# Patient Record
Sex: Male | Born: 1970 | Hispanic: No | Marital: Married | Smoking: Current some day smoker
Health system: Southern US, Community
[De-identification: ages and names within clinical notes are randomized; demographics above are authoritative.]

## PROBLEM LIST (undated history)

## (undated) DIAGNOSIS — I10 Essential (primary) hypertension: Secondary | ICD-10-CM

---

## 2007-02-04 DIAGNOSIS — K648 Other hemorrhoids: Secondary | ICD-10-CM | POA: Insufficient documentation

## 2007-08-18 DIAGNOSIS — M129 Arthropathy, unspecified: Secondary | ICD-10-CM

## 2007-08-18 DIAGNOSIS — K5730 Diverticulosis of large intestine without perforation or abscess without bleeding: Secondary | ICD-10-CM

## 2007-08-18 DIAGNOSIS — I10 Essential (primary) hypertension: Secondary | ICD-10-CM
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
## 2021-12-02 DIAGNOSIS — E876 Hypokalemia: Secondary | ICD-10-CM

## 2021-12-02 DIAGNOSIS — R519 Headache, unspecified: Secondary | ICD-10-CM

## 2021-12-02 DIAGNOSIS — R079 Chest pain, unspecified: Secondary | ICD-10-CM

## 2021-12-02 DIAGNOSIS — F17200 Nicotine dependence, unspecified, uncomplicated: Secondary | ICD-10-CM

## 2021-12-02 DIAGNOSIS — R7401 Elevation of levels of liver transaminase levels: Secondary | ICD-10-CM

## 2021-12-02 DIAGNOSIS — R9389 Abnormal findings on diagnostic imaging of other specified body structures: Secondary | ICD-10-CM

## 2021-12-02 DIAGNOSIS — I10 Essential (primary) hypertension: Principal | ICD-10-CM

## 2021-12-02 DIAGNOSIS — N179 Acute kidney failure, unspecified: Secondary | ICD-10-CM

## 2021-12-02 DIAGNOSIS — I161 Hypertensive emergency: Secondary | ICD-10-CM

## 2021-12-02 HISTORY — DX: Essential (primary) hypertension: I10

## 2021-12-02 NOTE — Assessment & Plan Note (Signed)
Creatinine 1.53, baseline unknown.  Suspect patient may have CKD unspecified related to HTN Continue to monitor renal function, avoid nephrotoxins

## 2021-12-02 NOTE — ED Notes (Signed)
Pt back from CT

## 2021-12-02 NOTE — ED Notes (Signed)
Pt to CT

## 2021-12-02 NOTE — Assessment & Plan Note (Signed)
Troponin elevation Troponin 30 with BNP 186.  EKG nonacute Suspect all related to demand ischemia from hypertensive emergency Continue to trend troponin Echocardiogram to evaluate for wall motion abnormality

## 2021-12-02 NOTE — ED Provider Notes (Signed)
[facility] Provider Note    Event Date/Time   First MD Initiated Contact with Patient 12/02/21 1953     (approximate)   History   Headache   HPI  Bryan Burgess is a 51 y.o. male who comes in with headaches, dizziness, nausea with high blood pressure for the past 3 days.  Patient been taking his hypertension meds but ran out of them 1 week ago.  Patient denies ever having history of headache.  He reports the headache was sudden and significant in onset and is been constant since then.  He did report a little bit of neck pain associated with it as well as some chest pain that is a 5 out of 10.  Denies ever having these previously.  He is not sure what blood pressure meds he previously was on.  He reports that with the blood pressure medicine given out front that he feels little bit more tired but he still is having his symptoms.     Physical Exam   Triage Vital Signs: ED Triage Vitals  Enc Vitals Group     BP 12/02/21 1835 (!) 258/139     Pulse Rate 12/02/21 1835 76     Resp 12/02/21 1835 17     Temp 12/02/21 1835 98.9 F (37.2 C)     Temp Source 12/02/21 1835 Oral     SpO2 12/02/21 1835 97 %     Weight 12/02/21 1838 210 lb (95.3 kg)     Height 12/02/21 1838 5' 4.96" (1.65 m)     Head Circumference --      Peak Flow --      Pain Score 12/02/21 1836 8     Pain Loc --      Pain Edu? --      Excl. in GC? --     Most recent vital signs: Vitals:   12/02/21 1835  BP: (!) 258/139  Pulse: 76  Resp: 17  Temp: 98.9 F (37.2 C)  SpO2: 97%     General: Awake, no distress.  CV:  Good peripheral perfusion.  Resp:  Normal effort.  Abd:  No distention.  No abdominal tenderness Other:  CN  Nerves II through XII appear intact.  Equal strength in arms and legs.   ED Results / Procedures / Treatments   Labs (all labs ordered are listed, but only abnormal results are displayed) Labs Reviewed  BASIC METABOLIC PANEL - Abnormal; Notable for the  following components:      Result Value   Potassium 3.2 (*)    Glucose, Bld 137 (*)    Creatinine, Ser 1.53 (*)    Calcium 8.8 (*)    GFR, Estimated 55 (*)    All other components within normal limits  CBC - Abnormal; Notable for the following components:   WBC 10.9 (*)    All other components within normal limits  TROPONIN I (HIGH SENSITIVITY) - Abnormal; Notable for the following components:   Troponin I (High Sensitivity) 27 (*)    All other components within normal limits     EKG  My interpretation of EKG:  Normal sinus rate of 68 with T wave inversions in 1-3 V4 V5 and V6 with normal intervals  RADIOLOGY I have reviewed the xray personally and interpreted and no evidence of widened mediastinum   PROCEDURES:  Critical Care performed: No  .1-3 Lead EKG Interpretation  Performed by: Concha Se, MD Authorized by: Concha Se, MD  Interpretation: normal     ECG rate:  77   ECG rate assessment: normal     Rhythm: sinus rhythm     Ectopy: none     Conduction: normal      MEDICATIONS ORDERED IN ED: Medications  cloNIDine (CATAPRES) tablet 0.1 mg (0.1 mg Oral Given 12/02/21 1846)     IMPRESSION / MDM / ASSESSMENT AND PLAN / ED COURSE  I reviewed the triage vital signs and the nursing notes.   Patient's presentation is most consistent with acute presentation with potential threat to life or bodily function.   Differential includes hypertensive emergency, urgency.  CT head ordered from triage that was negative and I am concerned about the possibility of aneurysm given significantly high elevated blood pressure therefore will get CTA.  As well as CT dissection given the chest pain.  I do not have any prior labs to evaluate.  Trop 27 Bmp cr elevated unclear baseline Cbc re-assuring  Patient's BP has come down significantly limit the clonidine but given he still having symptoms we will treat with migraine cocktail  Patient's headache is feeling better but  given his severely abnormal EKG and elevated blood pressures and the CT scans with incidental findings concerning for heart failure I feel that patient should be admitted for echocardiogram blood pressure monitoring and to get care established.  Patient is comfortable with this plan.  CTA still pending but given the headache is resolving I have low suspicion for aneurysmal bleed.  Will discuss with hospital team for admission.  I did discuss the incidental findings with patient on CT imaging and follow-up with pulmonary and repeat CT imaging to rule out cancer.      The patient is on the cardiac monitor to evaluate for evidence of arrhythmia and/or significant heart rate changes.      FINAL CLINICAL IMPRESSION(S) / ED DIAGNOSES   Final diagnoses:  Hypertension, unspecified type  Chest pain, unspecified type     Rx / DC Orders   ED Discharge Orders     None        Note:  This document was prepared using Dragon voice recognition software and may include unintentional dictation errors.   Concha Se, MD 12/02/21 2258

## 2021-12-02 NOTE — Assessment & Plan Note (Signed)
CTA chest showing the following  Masslike area in the LEFT lower lobe present as far back as 2008 with enlargement since that time. 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 Pulmonary consultation may be helpful as these areas can become superinfected.   Mild nodal enlargement throughout the chest without frank pathologic enlargement but with numerous lymph nodes without clear cause. The possibility of underlying malignancy is not excluded. Short interval follow-up within 3 months may be helpful to track for any changes. Would also correlate with any current signs of are history of heart failure as this can also present with mild nodal enlargement.

## 2021-12-02 NOTE — Assessment & Plan Note (Signed)
Mild transaminitis in the 50s with hepatic steatosis seen on CTA chest 

## 2021-12-02 NOTE — Assessment & Plan Note (Deleted)
Mild transaminitis in the 50s with hepatic steatosis seen on CTA chest

## 2021-12-02 NOTE — ED Triage Notes (Signed)
Patient reports headache, dizziness, nausea and high blood pressure x 3 days. Patient had been taking HTN meds, but ran out of them one week ago.

## 2021-12-02 NOTE — ED Provider Triage Note (Signed)
Emergency Medicine Provider Triage Evaluation Note  Bryan Burgess , a 51 y.o. male  was evaluated in triage.  Pt complains of 8 out of 10, acute and aching, frontal headache.  Patient states that he has been out of his blood pressure medicine for approximately 1 week and cannot remember what medicines he used to take.  He states that he has had associated dizziness and nausea but no vomiting.  Review of Systems  Positive: Patient has headache and dizziness. Negative: No chest pain or abdominal pain.   Physical Exam  BP (!) 258/139 (BP Location: Left Arm)   Pulse 76   Temp 98.9 F (37.2 C) (Oral)   Resp 17   SpO2 97%  Gen:   Awake, no distress   Resp:  Normal effort  MSK:   Moves extremities without difficulty    Medical Decision Making  Medically screening exam initiated at 6:38 PM.  Appropriate orders placed.  Bryan Burgess was informed that the remainder of the evaluation will be completed by another provider, this initial triage assessment does not replace that evaluation, and the importance of remaining in the ED until their evaluation is complete.     Pia Mau [place], [place] 12/02/21 1839

## 2021-12-03 DIAGNOSIS — N179 Acute kidney failure, unspecified: Secondary | ICD-10-CM

## 2021-12-03 DIAGNOSIS — E876 Hypokalemia: Secondary | ICD-10-CM

## 2021-12-03 DIAGNOSIS — F17200 Nicotine dependence, unspecified, uncomplicated: Secondary | ICD-10-CM

## 2021-12-03 DIAGNOSIS — R519 Headache, unspecified: Secondary | ICD-10-CM

## 2021-12-03 DIAGNOSIS — R7401 Elevation of levels of liver transaminase levels: Secondary | ICD-10-CM

## 2021-12-03 NOTE — Assessment & Plan Note (Signed)
Secondary to hypertensive emergency BP control Pain control

## 2021-12-03 NOTE — Assessment & Plan Note (Signed)
-  Nicotine patch 

## 2021-12-03 NOTE — Assessment & Plan Note (Signed)
Potassium 3.2.  Oral repletion ordered

## 2021-12-03 NOTE — ED Notes (Addendum)
Breakfast tray at bedside. Patient ambulatory to restroom without difficulties.

## 2021-12-03 NOTE — ED Notes (Signed)
Pt provided a sandwich tray - Dr. Para March @ the bedside.

## 2021-12-03 NOTE — Progress Notes (Signed)
*  PRELIMINARY RESULTS* Echocardiogram 2D Echocardiogram has been performed.  Bryan Burgess 12/03/2021, 3:55 PM

## 2021-12-03 NOTE — Progress Notes (Addendum)
PROGRESS NOTE    Bryan Burgess  ZOX:[id number] DOB: [birth year] DOA: 12/02/2021 PCP: Pcp, No    Assessment & Plan:   Principal Problem:   Hypertensive emergency Active Problems:   Chest pain   AKI (acute kidney injury) (HCC)   Abnormal chest CT   Transaminitis   Headache   Hypokalemia   Nicotine dependence  Assessment and Plan: Hypertensive emergency: continue on home dose of metoprolol, lisinopril & hydralazine. IV hydralazine prn   Acute systolic CHF: EF shows 45-50%, normal diastolic function, no regional wall motion abnormalities, no atrial shunts detected. Continue on lasix & metoprolol. Monitor I/Os   Chest pain: w/ elevated troponins. Likely secondary to demand ischemia.    Nicotine dependence: smoking cessation counseling. Nicotine patch to prevent w/drawal    Hypokalemia: potassium given. Mg is WNL    Headache: likely secondary to HTN emergency.    Transaminitis: likely due to hepatic steatosis seen on CTA. Will continue to monitor   AKI: Cr is trending down from day prior. Avoid nephrotoxic meds   Lymphadenopathy: w/ mass like area in left lower lobe present since 2008 but enlarged since that time. Etiology unclear, malignancy cannot be excluded. Will need to see pulmon outpatient for further evaluation and will discuss w/ pt   Obesity: BMI 34.9. Complicates overall care & prognosis      DVT prophylaxis: lovenox  Code Status: full  Family Communication:  Disposition Plan: likely d/c back home   Level of care: Med-Surg  Status is: Inpatient Remains inpatient appropriate because: severity of illness    Consultants:    Procedures:   Antimicrobials:  Subjective: Pt c/o headache   Objective: Vitals:   12/03/21 1004 12/03/21 1053 12/03/21 1441 12/03/21 1606  BP: (!) 186/116 (!) 167/105 (!) 182/123 (!) 194/130  Pulse: 69  65 64  Resp: 18  (!) 33 16  Temp:   97.6 F (36.4 C) 97.9 F (36.6 C)  TempSrc:   Oral   SpO2: 96%  95% 98%  Weight:       Height:       No intake or output data in the 24 hours ending 12/03/21 1634 Filed Weights   12/02/21 1838  Weight: 95.3 kg    Examination:  General exam: Appears calm and comfortable  Respiratory system: Clear to auscultation. Respiratory effort normal. Cardiovascular system: S1 & S2 +. No  rubs, gallops or clicks.  Gastrointestinal system: Abdomen is nondistended, soft and nontender.  Normal bowel sounds heard. Central nervous system: Alert and oriented. Moves all extremities  Psychiatry: Judgement and insight appear normal. Flat mood and affect     Data Reviewed: I have personally reviewed following labs and imaging studies  CBC: Recent Labs  Lab 12/02/21 1843  WBC 10.9*  HGB 15.8  HCT 44.7  MCV 82.2  PLT 261   Basic Metabolic Panel: Recent Labs  Lab 12/02/21 1843 12/03/21 0614  NA 140 137  K 3.2* 3.0*  CL 106 106  CO2 25 24  GLUCOSE 137* 120*  BUN 20 21*  CREATININE 1.53* 1.46*  CALCIUM 8.8* 8.5*  MG  --  2.3   GFR: Estimated Creatinine Clearance: 63.5 mL/min (A) (by C-G formula based on SCr of 1.46 mg/dL (H)). Liver Function Tests: Recent Labs  Lab 12/02/21 1843  AST 58*  ALT 55*  ALKPHOS 69  BILITOT 0.5  PROT 6.8  ALBUMIN 3.6   No results for input(s): "LIPASE", "AMYLASE" in the last 168 hours. No results for input(s): "AMMONIA" in  the last 168 hours. Coagulation Profile: No results for input(s): "INR", "PROTIME" in the last 168 hours. Cardiac Enzymes: No results for input(s): "CKTOTAL", "CKMB", "CKMBINDEX", "TROPONINI" in the last 168 hours. BNP (last 3 results) No results for input(s): "PROBNP" in the last 8760 hours. HbA1C: No results for input(s): "HGBA1C" in the last 72 hours. CBG: No results for input(s): "GLUCAP" in the last 168 hours. Lipid Profile: No results for input(s): "CHOL", "HDL", "LDLCALC", "TRIG", "CHOLHDL", "LDLDIRECT" in the last 72 hours. Thyroid Function Tests: No results for input(s): "TSH", "T4TOTAL",  "FREET4", "T3FREE", "THYROIDAB" in the last 72 hours. Anemia Panel: No results for input(s): "VITAMINB12", "FOLATE", "FERRITIN", "TIBC", "IRON", "RETICCTPCT" in the last 72 hours. Sepsis Labs: No results for input(s): "PROCALCITON", "LATICACIDVEN" in the last 168 hours.  No results found for this or any previous visit (from the past 240 hour(s)).       Radiology Studies: ECHOCARDIOGRAM COMPLETE  Result Date: 12/03/2021    ECHOCARDIOGRAM REPORT   Patient Name:   Bryan Burgess Date of Exam: 12/03/2021 Medical Rec #:  [id number]        Height:       65.0 in Accession #:    [id number]       Weight:       210.0 lb Date of Birth:  1971/02/26         BSA:          2.019 m Patient Age:    51 years         BP:           182/123 mmHg Patient Gender: M                HR:           68 bpm. Exam Location:  [facility] Procedure: 2D Echo, Color Doppler and Cardiac Doppler Indications:     I50.31 congestive heart failure-Acute Diastolic  History:         Patient has no prior history of Echocardiogram examinations.                  Risk Factors:Hypertension.  Sonographer:     Humphrey Rolls Referring Phys:  [id number] Andris Baumann Diagnosing Phys: Arnoldo Hooker MD  Sonographer Comments: Suboptimal parasternal window and suboptimal subcostal window. IMPRESSIONS  1. Left ventricular ejection fraction, by estimation, is 45 to 50%. The left ventricle has mildly decreased function. The left ventricle has no regional wall motion abnormalities. Left ventricular diastolic parameters were normal.  2. Right ventricular systolic function is normal. The right ventricular size is normal.  3. The mitral valve is normal in structure. Mild to moderate mitral valve regurgitation.  4. The aortic valve is normal in structure. Aortic valve regurgitation is not visualized. FINDINGS  Left Ventricle: Left ventricular ejection fraction, by estimation, is 45 to 50%. The left ventricle has mildly decreased function. The left ventricle has no regional  wall motion abnormalities. The left ventricular internal cavity size was normal in size. There is no left ventricular hypertrophy. Left ventricular diastolic parameters were normal. Right Ventricle: The right ventricular size is normal. No increase in right ventricular wall thickness. Right ventricular systolic function is normal. Left Atrium: Left atrial size was normal in size. Right Atrium: Right atrial size was normal in size. Pericardium: There is no evidence of pericardial effusion. Mitral Valve: The mitral valve is normal in structure. Mild to moderate mitral valve regurgitation. Tricuspid Valve: The tricuspid valve is normal in structure. Tricuspid valve regurgitation  is mild. Aortic Valve: The aortic valve is normal in structure. Aortic valve regurgitation is not visualized. Aortic valve mean gradient measures 11.0 mmHg. Aortic valve peak gradient measures 19.2 mmHg. Aortic valve area, by VTI measures 1.66 cm. Pulmonic Valve: The pulmonic valve was normal in structure. Pulmonic valve regurgitation is not visualized. Aorta: The aortic root and ascending aorta are structurally normal, with no evidence of dilitation. IAS/Shunts: No atrial level shunt detected by color flow Doppler.  LEFT VENTRICLE PLAX 2D LVIDd:         4.10 cm   Diastology LVIDs:         2.88 cm   LV e' medial:    5.11 cm/s LV PW:         1.72 cm   LV E/e' medial:  19.1 LV IVS:        1.49 cm   LV e' lateral:   5.66 cm/s LVOT diam:     1.90 cm   LV E/e' lateral: 17.3 LV SV:         62 LV SV Index:   31 LVOT Area:     2.84 cm  RIGHT VENTRICLE RV Basal diam:  3.45 cm RV S prime:     11.10 cm/s TAPSE (M-mode): 2.4 cm LEFT ATRIUM             Index        RIGHT ATRIUM           Index LA diam:        4.40 cm 2.18 cm/m   RA Area:     15.70 cm LA Vol (A2C):   55.0 ml 27.25 ml/m  RA Volume:   38.10 ml  18.87 ml/m LA Vol (A4C):   79.8 ml 39.53 ml/m LA Biplane Vol: 71.4 ml 35.37 ml/m  AORTIC VALVE                     PULMONIC VALVE AV Area  (Vmax):    1.58 cm      PV Vmax:       1.15 m/s AV Area (Vmean):   1.54 cm      PV Peak grad:  5.3 mmHg AV Area (VTI):     1.66 cm AV Vmax:           219.00 cm/s AV Vmean:          157.000 cm/s AV VTI:            0.374 m AV Peak Grad:      19.2 mmHg AV Mean Grad:      11.0 mmHg LVOT Vmax:         122.00 cm/s LVOT Vmean:        85.200 cm/s LVOT VTI:          0.219 m LVOT/AV VTI ratio: 0.59  AORTA Ao Root diam: 3.20 cm MITRAL VALVE               TRICUSPID VALVE MV Area (PHT): 3.48 cm    TR Peak grad:   43.8 mmHg MV Decel Time: 218 msec    TR Vmax:        331.00 cm/s MV E velocity: 97.70 cm/s MV A velocity: 67.70 cm/s  SHUNTS MV E/A ratio:  1.44        Systemic VTI:  0.22 m  Systemic Diam: 1.90 cm Arnoldo HookerBruce Kowalski MD Electronically signed by Arnoldo HookerBruce Kowalski MD Signature Date/Time: 12/03/2021/4:24:11 PM    Final    CT ANGIO HEAD NECK W WO CM  Result Date: 12/02/2021 CLINICAL DATA:  Initial evaluation for acute headache. EXAM: CT ANGIOGRAPHY HEAD AND NECK TECHNIQUE: Multidetector CT imaging of the head and neck was performed using the standard protocol during bolus administration of intravenous contrast. Multiplanar CT image reconstructions and MIPs were obtained to evaluate the vascular anatomy. Carotid stenosis measurements (when applicable) are obtained utilizing NASCET criteria, using the distal internal carotid diameter as the denominator. RADIATION DOSE REDUCTION: This exam was performed according to the departmental dose-optimization program which includes automated exposure control, adjustment of the mA and/or kV according to patient size and/or use of iterative reconstruction technique. CONTRAST:  125mL OMNIPAQUE IOHEXOL 350 MG/ML SOLN COMPARISON:  CT from earlier the same day. FINDINGS: CTA NECK FINDINGS Aortic arch: Visualized aortic arch normal caliber with standard 3 vessel morphology. No stenosis or other abnormality about the origin the great vessels. Right carotid system:  Right common and internal carotid arteries patent without stenosis or dissection. Left carotid system: Left common and internal carotid arteries patent without stenosis or dissection. Vertebral arteries: Both vertebral arteries arise from the subclavian arteries. No proximal subclavian artery stenosis. Both vertebral arteries widely patent without stenosis, dissection or occlusion. Skeleton: No discrete or worrisome osseous lesions. Other neck: No other acute soft tissue abnormality within the neck. Upper chest: Multiple mildly prominent subcentimeter lymph nodes noted within the visualized mediastinum, nonspecific, but could be reactive. Visualized upper chest demonstrates no other acute finding. Review of the MIP images confirms the above findings CTA HEAD FINDINGS Anterior circulation: Both internal carotid arteries widely patent to the termini without stenosis. A1 segments widely patent. Normal anterior communicating artery complex. Both anterior cerebral arteries widely patent to their distal aspects without stenosis. No M1 stenosis or occlusion. Normal MCA bifurcations. Distal MCA branches well perfused and symmetric. Posterior circulation: Both vertebral arteries patent without stenosis. Left vertebral artery dominant. Both PICA patent. Basilar patent to its distal aspect without stenosis. Superior cerebellar arteries patent bilaterally. Left PCA primarily supplied via the basilar. Predominant fetal type origin of the right PCA. Both PCAs patent to their distal aspects without stenosis. Venous sinuses: Patent allowing for timing the contrast bolus. Anatomic variants: As above.  No intracranial aneurysm. Review of the MIP images confirms the above findings IMPRESSION: 1. Normal CTA of the head and neck. No large vessel occlusion, hemodynamically significant stenosis, or other acute vascular abnormality. No aneurysm. 2. Multiple mildly prominent subcentimeter lymph nodes within the visualized mediastinum,  nonspecific, but could be reactive. Electronically Signed   By: Rise MuBenjamin  McClintock M.D.   On: 12/02/2021 23:03   CT Angio Chest Aorta W and/or Wo Contrast  Result Date: 12/02/2021 CLINICAL DATA:  51 year old male presents with suspected acute aortic syndrome and chest pain. EXAM: CT ANGIOGRAPHY CHEST WITH CONTRAST TECHNIQUE: Multidetector CT imaging of the chest was performed using the standard protocol during bolus administration of intravenous contrast. Multiplanar CT image reconstructions and MIPs were obtained to evaluate the vascular anatomy. RADIATION DOSE REDUCTION: This exam was performed according to the departmental dose-optimization program which includes automated exposure control, adjustment of the mA and/or kV according to patient size and/or use of iterative reconstruction technique. CONTRAST:  125mL OMNIPAQUE IOHEXOL 350 MG/ML SOLN COMPARISON:  Prior CT of the chest, abdomen and pelvis from 2008. FINDINGS: Cardiovascular: Noncontrast imaging without stranding adjacent to the aorta  and without signs of intramural hematoma. Aortic contour is smooth. Mildly limited study due to bolus timing but without signs of aortic dissection or aneurysm. Three-vessel branching pattern in the chest. Ascending thoracic aortic caliber 3.5 cm. Descending thoracic aortic caliber 2.6 cm. Heart size moderate to markedly enlarged. No pericardial effusion or signs of pericardial nodularity. Central pulmonary vasculature unremarkable on venous phase with limited assessment due to bolus timing. Mediastinum/Nodes: Scattered lymph nodes throughout the chest not exceeding 15 mm short axis but some with top-normal size, for instance (image 49/6) RIGHT paratracheal lymph node 14 mm. (Image 48/6) 14 mm AP window lymph node. Numerous other smaller lymph nodes in the chest. Lungs/Pleura: Ovoid masslike area in the LEFT lung base measuring 4.6 x 3.4 cm with mixed attenuation was present as far back as 2008 measuring 3.3 x 2.6 cm  appeared more compatible with atelectatic lung at that time. Subtle accessory fissure may track away from this area. No discrete vascular structure extending towards this area though perhaps a draining vein passing into the hemi azygous system (image 119/6). No pneumothorax. No pleural effusion. Airways are patent. Query mild septal thickening at the lung bases and mild ground-glass though there is clearly atelectasis and there is respiratory motion. Upper Abdomen: Marked hepatic steatosis. Lobular hepatic contours. Liver is incompletely imaged. Musculoskeletal: Spinal degenerative changes. No acute or destructive bone process. Review of the MIP images confirms the above findings. IMPRESSION: 1. No signs of acute aortic process. Study mildly limited by bolus timing. 2. Masslike area in the LEFT lower lobe present as far back as 2008 with enlargement since that time. 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 Venous drainage potentially via systemic venous drainage via azygous system. Pulmonary consultation may be helpful as these areas can become superinfected. 3. Peri mild septal thickening and some ground-glass attenuation though areas of atelectasis and respiratory motion limit assessment. Correlate with signs of heart failure. 4. Mild nodal enlargement throughout the chest without frank pathologic enlargement but with numerous lymph nodes without clear cause. The possibility of underlying malignancy is not excluded. Short interval follow-up within 3 months may be helpful to track for any changes. Would also correlate with any current signs of are history of heart failure as this can also present with mild nodal enlargement. 5. Hepatic steatosis. Correlate with clinical or laboratory evidence of hepatic dysfunction given severity of steatotic changes. Aortic Atherosclerosis (ICD10-I70.0). Electronically Signed   By:  Donzetta Kohut M.D.   On: 12/02/2021 21:51   DG Chest 2 View  Result Date: 12/02/2021 CLINICAL DATA:  Chest pain EXAM: CHEST - 2 VIEW COMPARISON:  None Available. FINDINGS: The heart size and mediastinal contours are within normal limits. Both lungs are clear. The visualized skeletal structures are unremarkable. IMPRESSION: No active cardiopulmonary disease. Electronically Signed   By: Charlett Nose M.D.   On: 12/02/2021 19:44   CT Head Wo Contrast  Result Date: 12/02/2021 CLINICAL DATA:  Male at age 67 presents for evaluation of headache, new or worsening headache. EXAM: CT HEAD WITHOUT CONTRAST TECHNIQUE: Contiguous axial images were obtained from the base of the skull through the vertex without intravenous contrast. RADIATION DOSE REDUCTION: This exam was performed according to the departmental dose-optimization program which includes automated exposure control, adjustment of the mA and/or kV according to patient size and/or use of iterative reconstruction technique. COMPARISON:  None Available. FINDINGS: Brain: No evidence of acute infarction, hemorrhage, hydrocephalus, extra-axial collection  or mass lesion/mass effect. Signs of mild atrophy and chronic microvascular ischemic change. Chronic appearing lacunar infarcts in the RIGHT and LEFT basal ganglia. Vascular: No hyperdense vessel or unexpected calcification. Skull: Normal. Negative for fracture or focal lesion. Sinuses/Orbits: Visualized paranasal sinuses and orbits without acute process. Maxillary sinuses are incompletely evaluated. Other: None IMPRESSION: 1. No acute intracranial abnormality. 2. Signs of mild atrophy and chronic microvascular ischemic change. Chronic appearing lacunar infarcts in the bilateral basal ganglia. Electronically Signed   By: Donzetta Kohut M.D.   On: 12/02/2021 19:18        Scheduled Meds:  aspirin EC  81 mg Oral Daily   enoxaparin (LOVENOX) injection  0.5 mg/kg Subcutaneous Q24H   furosemide  20 mg Intravenous  BID   hydrALAZINE  100 mg Oral Q8H   lisinopril  20 mg Oral Daily   metoprolol tartrate  50 mg Oral BID   nicotine  14 mg Transdermal Daily   Continuous Infusions:   LOS: 0 days    Time spent: 35 mins     Charise Killian, MD [facility] Pager [phone]  If 7PM-7AM, please contact night-coverage [email or web address] 12/03/2021, 4:34 PM

## 2021-12-03 NOTE — Progress Notes (Signed)
PHARMACIST - PHYSICIAN COMMUNICATION  CONCERNING:  Enoxaparin (Lovenox) for DVT Prophylaxis    RECOMMENDATION: Patient was prescribed enoxaprin 40mg  q24 hours for VTE prophylaxis.   Filed Weights   12/02/21 1838  Weight: 95.3 kg (210 lb)    Body mass index is 34.99 kg/m.  Estimated Creatinine Clearance: 60.6 mL/min (A) (by C-G formula based on SCr of 1.53 mg/dL (H)).   Based on [facility] policy patient is candidate for enoxaparin 0.5mg /kg TBW SQ every 24 hours based on BMI being >30.  DESCRIPTION: Pharmacy has adjusted enoxaparin dose per [facility] policy.  Patient is now receiving enoxaparin 0.5 mg/kg every 24 hours   [facility], PharmD, [facility] 12/03/2021 12:18 AM

## 2021-12-04 DIAGNOSIS — E876 Hypokalemia: Secondary | ICD-10-CM

## 2021-12-04 DIAGNOSIS — R519 Headache, unspecified: Secondary | ICD-10-CM

## 2021-12-04 DIAGNOSIS — I10 Essential (primary) hypertension: Secondary | ICD-10-CM

## 2021-12-04 NOTE — Progress Notes (Signed)
PROGRESS NOTE    Bryan Burgess  ZDG:[id number] DOB: 1970-12-30 DOA: 12/02/2021 PCP: Pcp, No    Assessment & Plan:   Principal Problem:   Hypertensive emergency Active Problems:   Chest pain   AKI (acute kidney injury) (HCC)   Abnormal chest CT   Transaminitis   Headache   Hypokalemia   Nicotine dependence  Assessment and Plan: Hypertensive emergency: emergency resolved but still w/ HTN. Continue on home dose of metoprolol, hydralazine, and increase dose of lisinopril. IV hydralazine prn   Acute systolic CHF: EF shows 45-50%, normal diastolic function, no regional wall motion abnormalities, no atrial shunts detected. Continue on lasix, metoprolol. Monitor I/Os    Chest pain: w/ elevated troponins. Likely secondary to demand ischemia    Nicotine dependence: received smoking cessation counseling x 5 mins. Nicotine patch to prevent w/drawl    Hypokalemia: KCl repleated    Headache: likely secondary to HTN emergency. Improved w/ fioricet    Transaminitis: likely due to hepatic steatosis seen on CTA. Will continue to monitor   AKI: Cr is labile. Avoid nephrotoxic meds   Lymphadenopathy: w/ mass like area in left lower lobe present since 2008 but enlarged since that time. Etiology unclear, malignancy cannot be excluded. Will need to see pulmon outpatient for further evaluation. Discussed w/ pt via interpreter and pt verbalized his understanding.   Obesity: BMI 37.3. Complicates overall care & prognosis      DVT prophylaxis: lovenox  Code Status: full  Family Communication:  Disposition Plan: likely d/c back home   Level of care: Med-Surg  Status is: Inpatient Remains inpatient appropriate because: severity of illness    Consultants:    Procedures:   Antimicrobials:  Subjective: Pt c/o fatigue   Objective: Vitals:   12/04/21 0643 12/04/21 0853 12/04/21 1358 12/04/21 1538  BP: 132/78 (!) 152/93 (!) 190/117 (!) 170/96  Pulse: 62 65 62 71  Resp:  16  16   Temp:  98 F (36.7 C)  97.8 F (36.6 C)  TempSrc:      SpO2:  96% 97% 96%  Weight:      Height:        Intake/Output Summary (Last 24 hours) at 12/04/2021 1557 Last data filed at 12/04/2021 1048 Gross per 24 hour  Intake 360 ml  Output 1200 ml  Net -840 ml   Filed Weights   12/02/21 1838 12/04/21 0335  Weight: 95.3 kg 101.8 kg    Examination:  General exam: Appears comfortable  Respiratory system: clear breath sounds b/l Cardiovascular system: S1/S2+. No rubs or gallops  Gastrointestinal system: Abd is soft, NT, obese & normal bowel sounds  Central nervous system: alert and oriented. Moves all extremities  Psychiatry: Judgement and insight appears normal. Flat mood and affect     Data Reviewed: I have personally reviewed following labs and imaging studies  CBC: Recent Labs  Lab 12/02/21 1843 12/04/21 0452  WBC 10.9* 9.1  HGB 15.8 15.8  HCT 44.7 45.5  MCV 82.2 82.3  PLT 261 232   Basic Metabolic Panel: Recent Labs  Lab 12/02/21 1843 12/03/21 0614 12/04/21 0452  NA 140 137 139  K 3.2* 3.0* 3.1*  CL 106 106 105  CO2 25 24 26   GLUCOSE 137* 120* 128*  BUN 20 21* 21*  CREATININE 1.53* 1.46* 1.52*  CALCIUM 8.8* 8.5* 8.3*  MG  --  2.3  --    GFR: Estimated Creatinine Clearance: 63.1 mL/min (A) (by C-G formula based on SCr of 1.52 mg/dL (  H)). Liver Function Tests: Recent Labs  Lab 12/02/21 1843  AST 58*  ALT 55*  ALKPHOS 69  BILITOT 0.5  PROT 6.8  ALBUMIN 3.6   No results for input(s): "LIPASE", "AMYLASE" in the last 168 hours. No results for input(s): "AMMONIA" in the last 168 hours. Coagulation Profile: No results for input(s): "INR", "PROTIME" in the last 168 hours. Cardiac Enzymes: No results for input(s): "CKTOTAL", "CKMB", "CKMBINDEX", "TROPONINI" in the last 168 hours. BNP (last 3 results) No results for input(s): "PROBNP" in the last 8760 hours. HbA1C: No results for input(s): "HGBA1C" in the last 72 hours. CBG: No results for  input(s): "GLUCAP" in the last 168 hours. Lipid Profile: No results for input(s): "CHOL", "HDL", "LDLCALC", "TRIG", "CHOLHDL", "LDLDIRECT" in the last 72 hours. Thyroid Function Tests: No results for input(s): "TSH", "T4TOTAL", "FREET4", "T3FREE", "THYROIDAB" in the last 72 hours. Anemia Panel: No results for input(s): "VITAMINB12", "FOLATE", "FERRITIN", "TIBC", "IRON", "RETICCTPCT" in the last 72 hours. Sepsis Labs: No results for input(s): "PROCALCITON", "LATICACIDVEN" in the last 168 hours.  No results found for this or any previous visit (from the past 240 hour(s)).       Radiology Studies: ECHOCARDIOGRAM COMPLETE  Result Date: 12/03/2021    ECHOCARDIOGRAM REPORT   Patient Name:   Bryan TREINEN Date of Exam: 12/03/2021 Medical Rec #:  [id number]        Height:       65.0 in Accession #:    [id number]       Weight:       210.0 lb Date of Birth:  May 12, 1971         BSA:          2.019 m Patient Age:    51 years         BP:           182/123 mmHg Patient Gender: M                HR:           68 bpm. Exam Location:  [facility] Procedure: 2D Echo, Color Doppler and Cardiac Doppler Indications:     I50.31 congestive heart failure-Acute Diastolic  History:         Patient has no prior history of Echocardiogram examinations.                  Risk Factors:Hypertension.  Sonographer:     Humphrey Rolls Referring Phys:  [id number] Andris Baumann Diagnosing Phys: Arnoldo Hooker MD  Sonographer Comments: Suboptimal parasternal window and suboptimal subcostal window. IMPRESSIONS  1. Left ventricular ejection fraction, by estimation, is 45 to 50%. The left ventricle has mildly decreased function. The left ventricle has no regional wall motion abnormalities. Left ventricular diastolic parameters were normal.  2. Right ventricular systolic function is normal. The right ventricular size is normal.  3. The mitral valve is normal in structure. Mild to moderate mitral valve regurgitation.  4. The aortic valve is normal in  structure. Aortic valve regurgitation is not visualized. FINDINGS  Left Ventricle: Left ventricular ejection fraction, by estimation, is 45 to 50%. The left ventricle has mildly decreased function. The left ventricle has no regional wall motion abnormalities. The left ventricular internal cavity size was normal in size. There is no left ventricular hypertrophy. Left ventricular diastolic parameters were normal. Right Ventricle: The right ventricular size is normal. No increase in right ventricular wall thickness. Right ventricular systolic function is normal. Left Atrium: Left atrial size was  normal in size. Right Atrium: Right atrial size was normal in size. Pericardium: There is no evidence of pericardial effusion. Mitral Valve: The mitral valve is normal in structure. Mild to moderate mitral valve regurgitation. Tricuspid Valve: The tricuspid valve is normal in structure. Tricuspid valve regurgitation is mild. Aortic Valve: The aortic valve is normal in structure. Aortic valve regurgitation is not visualized. Aortic valve mean gradient measures 11.0 mmHg. Aortic valve peak gradient measures 19.2 mmHg. Aortic valve area, by VTI measures 1.66 cm. Pulmonic Valve: The pulmonic valve was normal in structure. Pulmonic valve regurgitation is not visualized. Aorta: The aortic root and ascending aorta are structurally normal, with no evidence of dilitation. IAS/Shunts: No atrial level shunt detected by color flow Doppler.  LEFT VENTRICLE PLAX 2D LVIDd:         4.10 cm   Diastology LVIDs:         2.88 cm   LV e' medial:    5.11 cm/s LV PW:         1.72 cm   LV E/e' medial:  19.1 LV IVS:        1.49 cm   LV e' lateral:   5.66 cm/s LVOT diam:     1.90 cm   LV E/e' lateral: 17.3 LV SV:         62 LV SV Index:   31 LVOT Area:     2.84 cm  RIGHT VENTRICLE RV Basal diam:  3.45 cm RV S prime:     11.10 cm/s TAPSE (M-mode): 2.4 cm LEFT ATRIUM             Index        RIGHT ATRIUM           Index LA diam:        4.40 cm 2.18  cm/m   RA Area:     15.70 cm LA Vol (A2C):   55.0 ml 27.25 ml/m  RA Volume:   38.10 ml  18.87 ml/m LA Vol (A4C):   79.8 ml 39.53 ml/m LA Biplane Vol: 71.4 ml 35.37 ml/m  AORTIC VALVE                     PULMONIC VALVE AV Area (Vmax):    1.58 cm      PV Vmax:       1.15 m/s AV Area (Vmean):   1.54 cm      PV Peak grad:  5.3 mmHg AV Area (VTI):     1.66 cm AV Vmax:           219.00 cm/s AV Vmean:          157.000 cm/s AV VTI:            0.374 m AV Peak Grad:      19.2 mmHg AV Mean Grad:      11.0 mmHg LVOT Vmax:         122.00 cm/s LVOT Vmean:        85.200 cm/s LVOT VTI:          0.219 m LVOT/AV VTI ratio: 0.59  AORTA Ao Root diam: 3.20 cm MITRAL VALVE               TRICUSPID VALVE MV Area (PHT): 3.48 cm    TR Peak grad:   43.8 mmHg MV Decel Time: 218 msec    TR Vmax:        331.00 cm/s MV E velocity: 97.70  cm/s MV A velocity: 67.70 cm/s  SHUNTS MV E/A ratio:  1.44        Systemic VTI:  0.22 m                            Systemic Diam: 1.90 cm Arnoldo Hooker MD Electronically signed by Arnoldo Hooker MD Signature Date/Time: 12/03/2021/4:24:11 PM    Final    CT ANGIO HEAD NECK W WO CM  Result Date: 12/02/2021 CLINICAL DATA:  Initial evaluation for acute headache. EXAM: CT ANGIOGRAPHY HEAD AND NECK TECHNIQUE: Multidetector CT imaging of the head and neck was performed using the standard protocol during bolus administration of intravenous contrast. Multiplanar CT image reconstructions and MIPs were obtained to evaluate the vascular anatomy. Carotid stenosis measurements (when applicable) are obtained utilizing NASCET criteria, using the distal internal carotid diameter as the denominator. RADIATION DOSE REDUCTION: This exam was performed according to the departmental dose-optimization program which includes automated exposure control, adjustment of the mA and/or kV according to patient size and/or use of iterative reconstruction technique. CONTRAST:  OMNIPAQUE IOHEXOL 350 MG/ML SOLN COMPARISON:  CT  from earlier the same day. FINDINGS: CTA NECK FINDINGS Aortic arch: Visualized aortic arch normal caliber with standard 3 vessel morphology. No stenosis or other abnormality about the origin the great vessels. Right carotid system: Right common and internal carotid arteries patent without stenosis or dissection. Left carotid system: Left common and internal carotid arteries patent without stenosis or dissection. Vertebral arteries: Both vertebral arteries arise from the subclavian arteries. No proximal subclavian artery stenosis. Both vertebral arteries widely patent without stenosis, dissection or occlusion. Skeleton: No discrete or worrisome osseous lesions. Other neck: No other acute soft tissue abnormality within the neck. Upper chest: Multiple mildly prominent subcentimeter lymph nodes noted within the visualized mediastinum, nonspecific, but could be reactive. Visualized upper chest demonstrates no other acute finding. Review of the MIP images confirms the above findings CTA HEAD FINDINGS Anterior circulation: Both internal carotid arteries widely patent to the termini without stenosis. A1 segments widely patent. Normal anterior communicating artery complex. Both anterior cerebral arteries widely patent to their distal aspects without stenosis. No M1 stenosis or occlusion. Normal MCA bifurcations. Distal MCA branches well perfused and symmetric. Posterior circulation: Both vertebral arteries patent without stenosis. Left vertebral artery dominant. Both PICA patent. Basilar patent to its distal aspect without stenosis. Superior cerebellar arteries patent bilaterally. Left PCA primarily supplied via the basilar. Predominant fetal type origin of the right PCA. Both PCAs patent to their distal aspects without stenosis. Venous sinuses: Patent allowing for timing the contrast bolus. Anatomic variants: As above.  No intracranial aneurysm. Review of the MIP images confirms the above findings IMPRESSION: 1. Normal CTA  of the head and neck. No large vessel occlusion, hemodynamically significant stenosis, or other acute vascular abnormality. No aneurysm. 2. Multiple mildly prominent subcentimeter lymph nodes within the visualized mediastinum, nonspecific, but could be reactive. Electronically Signed   By: Rise Mu M.D.   On: 12/02/2021 23:03   CT Angio Chest Aorta W and/or Wo Contrast  Result Date: 12/02/2021 CLINICAL DATA:  51 year old male presents with suspected acute aortic syndrome and chest pain. EXAM: CT ANGIOGRAPHY CHEST WITH CONTRAST TECHNIQUE: Multidetector CT imaging of the chest was performed using the standard protocol during bolus administration of intravenous contrast. Multiplanar CT image reconstructions and MIPs were obtained to evaluate the vascular anatomy. RADIATION DOSE REDUCTION: This exam was performed according to the departmental dose-optimization  program which includes automated exposure control, adjustment of the mA and/or kV according to patient size and/or use of iterative reconstruction technique. CONTRAST:  125mL OMNIPAQUE IOHEXOL 350 MG/ML SOLN COMPARISON:  Prior CT of the chest, abdomen and pelvis from 2008. FINDINGS: Cardiovascular: Noncontrast imaging without stranding adjacent to the aorta and without signs of intramural hematoma. Aortic contour is smooth. Mildly limited study due to bolus timing but without signs of aortic dissection or aneurysm. Three-vessel branching pattern in the chest. Ascending thoracic aortic caliber 3.5 cm. Descending thoracic aortic caliber 2.6 cm. Heart size moderate to markedly enlarged. No pericardial effusion or signs of pericardial nodularity. Central pulmonary vasculature unremarkable on venous phase with limited assessment due to bolus timing. Mediastinum/Nodes: Scattered lymph nodes throughout the chest not exceeding 15 mm short axis but some with top-normal size, for instance (image 49/6) RIGHT paratracheal lymph node 14 mm. (Image 48/6) 14 mm  AP window lymph node. Numerous other smaller lymph nodes in the chest. Lungs/Pleura: Ovoid masslike area in the LEFT lung base measuring 4.6 x 3.4 cm with mixed attenuation was present as far back as 2008 measuring 3.3 x 2.6 cm appeared more compatible with atelectatic lung at that time. Subtle accessory fissure may track away from this area. No discrete vascular structure extending towards this area though perhaps a draining vein passing into the hemi azygous system (image 119/6). No pneumothorax. No pleural effusion. Airways are patent. Query mild septal thickening at the lung bases and mild ground-glass though there is clearly atelectasis and there is respiratory motion. Upper Abdomen: Marked hepatic steatosis. Lobular hepatic contours. Liver is incompletely imaged. Musculoskeletal: Spinal degenerative changes. No acute or destructive bone process. Review of the MIP images confirms the above findings. IMPRESSION: 1. No signs of acute aortic process. Study mildly limited by bolus timing. 2. Masslike area in the LEFT lower lobe present as far back as 2008 with enlargement since that time. 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 Venous drainage potentially via systemic venous drainage via azygous system. Pulmonary consultation may be helpful as these areas can become superinfected. 3. Peri mild septal thickening and some ground-glass attenuation though areas of atelectasis and respiratory motion limit assessment. Correlate with signs of heart failure. 4. Mild nodal enlargement throughout the chest without frank pathologic enlargement but with numerous lymph nodes without clear cause. The possibility of underlying malignancy is not excluded. Short interval follow-up within 3 months may be helpful to track for any changes. Would also correlate with any current signs of are history of heart failure as this can also present  with mild nodal enlargement. 5. Hepatic steatosis. Correlate with clinical or laboratory evidence of hepatic dysfunction given severity of steatotic changes. Aortic Atherosclerosis (ICD10-I70.0). Electronically Signed   By: Donzetta KohutGeoffrey  Wile M.D.   On: 12/02/2021 21:51   DG Chest 2 View  Result Date: 12/02/2021 CLINICAL DATA:  Chest pain EXAM: CHEST - 2 VIEW COMPARISON:  None Available. FINDINGS: The heart size and mediastinal contours are within normal limits. Both lungs are clear. The visualized skeletal structures are unremarkable. IMPRESSION: No active cardiopulmonary disease. Electronically Signed   By: Charlett NoseKevin  Dover M.D.   On: 12/02/2021 19:44   CT Head Wo Contrast  Result Date: 12/02/2021 CLINICAL DATA:  Male at age 51 presents for evaluation of headache, new or worsening headache. EXAM: CT HEAD WITHOUT CONTRAST TECHNIQUE: Contiguous axial images were obtained from the base of the skull through the vertex  without intravenous contrast. RADIATION DOSE REDUCTION: This exam was performed according to the departmental dose-optimization program which includes automated exposure control, adjustment of the mA and/or kV according to patient size and/or use of iterative reconstruction technique. COMPARISON:  None Available. FINDINGS: Brain: No evidence of acute infarction, hemorrhage, hydrocephalus, extra-axial collection or mass lesion/mass effect. Signs of mild atrophy and chronic microvascular ischemic change. Chronic appearing lacunar infarcts in the RIGHT and LEFT basal ganglia. Vascular: No hyperdense vessel or unexpected calcification. Skull: Normal. Negative for fracture or focal lesion. Sinuses/Orbits: Visualized paranasal sinuses and orbits without acute process. Maxillary sinuses are incompletely evaluated. Other: None IMPRESSION: 1. No acute intracranial abnormality. 2. Signs of mild atrophy and chronic microvascular ischemic change. Chronic appearing lacunar infarcts in the bilateral basal ganglia.  Electronically Signed   By: Donzetta Kohut M.D.   On: 12/02/2021 19:18        Scheduled Meds:  aspirin EC  81 mg Oral Daily   enoxaparin (LOVENOX) injection  0.5 mg/kg Subcutaneous Q24H   furosemide  20 mg Intravenous Daily   hydrALAZINE  100 mg Oral Q8H   [START ON 12/05/2021] lisinopril  40 mg Oral Daily   metoprolol tartrate  50 mg Oral BID   nicotine  14 mg Transdermal Daily   Continuous Infusions:   LOS: 1 day    Time spent: 38 mins     Charise Killian, MD [facility] Pager [phone]  If 7PM-7AM, please contact night-coverage [email or web address] 12/04/2021, 3:57 PM

## 2021-12-05 DIAGNOSIS — F17218 Nicotine dependence, cigarettes, with other nicotine-induced disorders: Secondary | ICD-10-CM

## 2021-12-05 NOTE — Discharge Summary (Signed)
Physician Discharge Summary  Bryan Burgess [id number] DOB: [birth year] DOA: 12/02/2021  PCP: Pcp, No  Admit date: 12/02/2021 Discharge date: 12/05/2021  Admitted From:home Disposition:  home   Recommendations for Outpatient Follow-up:  Follow up with PCP in 1-2 weeks F/u w/ pulmon, Dr. Patsey Berthold, in 1-2 weeks  F/u w/ cardio, [facility], in 1-2 weeks   Home Health: no  Equipment/Devices:   Discharge Condition: stable  CODE STATUS: full  Diet recommendation: Heart Healthy    Brief/Interim Summary: HPI was taken from Dr. Damita Dunnings: Bryan Burgess is a 51 y.o. male with medical history significant for Hypertension, nicotine dependence and prior hospitalization for hypertensive emergency related to poor medication compliance who  who presents to the ED with a 3-day complaint of headaches, dizziness, nausea and chest pressure.  Patient admits to running out of his blood pressure medication a week prior.  Chest pressure is nonexertional and nonradiating.  He endorses shortness of breath with exertion and when lying flat but denies lower extremity edema.  He denies cough, fever or chills.  He states that he lost his job several months ago and it has been difficult to get his medication.  Additionally he feels like sometimes when he takes the medication he feels worse. ED course and data review: BP on arrival 258/139 but otherwise normal vitals Blood work: Troponin 30, BNP 184.  WBC 10,900.  Creatinine 1.53 with unknown baseline and sodium 3.2.  AST elevated with AST 58 and ALT 55. EKG, personally viewed and interpreted with NSR at 68 with few T wave inversions lateral leads otherwise nonacute Chest x-ray clear and CT head nonacute but showing chronic appearing lacunar infarcts in bilateral basal ganglia   CT angio aorta shows the following findings: IMPRESSION: 1. No signs of acute aortic process. Study mildly limited by bolus timing. 2. Masslike area in the LEFT lower lobe present as far back as  2008 with enlargement since that time. 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 Venous drainage potentially via systemic venous drainage via azygous system. Pulmonary consultation may be helpful as these areas can become superinfected. 3. Peri mild septal thickening and some ground-glass attenuation though areas of atelectasis and respiratory motion limit assessment. Correlate with signs of heart failure. 4. Mild nodal enlargement throughout the chest without frank pathologic enlargement but with numerous lymph nodes without clear cause. The possibility of underlying malignancy is not excluded. Short interval follow-up within 3 months may be helpful to track for any changes. Would also correlate with any current signs of are history of heart failure as this can also present with mild nodal enlargement. 5. Hepatic steatosis. Correlate with clinical or laboratory evidence of hepatic dysfunction given severity of steatotic changes.   CT angio head and neck shows the following: IMPRESSION: 1. Normal CTA of the head and neck. No large vessel occlusion, hemodynamically significant stenosis, or other acute vascular abnormality. No aneurysm. 2. Multiple mildly prominent subcentimeter lymph nodes within the visualized mediastinum, nonspecific, but could be reactive.   Patient given a dose of clonidine, Benadryl and Reglan with improvement in headache.  BP at the time of request for admission 186/97.  Hospitalist consulted for admission.  As per Dr. Jimmye Norman 7/12-7/14/23: Pt presented w/ HTN emergency and was treated w/ metoprolol, hydralazine, lisinopril as well as IV hydralazine prn. Pt also c/o headache that improved w/ fioricet. Of note, pt was found to have abnormal findings on CT chest (as stated below)  that will need f/u w/ pulmonologist. This was discussed w/ pt via interpreter and pt verbalized  his understanding.    Discharge Diagnoses:  Principal Problem:   Hypertensive emergency Active Problems:   Chest pain   AKI (acute kidney injury) ([place])   Abnormal chest CT   Transaminitis   Headache   Hypokalemia   Nicotine dependence  Hypertensive emergency: emergency resolved but still w/ HTN. Continue on home dose of metoprolol, hydralazine,HCTZ-lisinopril at d/c. IV hydralazine prn    Acute systolic CHF: EF shows [id number], normal diastolic function, no regional wall motion abnormalities, no atrial shunts detected. Continue on lasix, metoprolol. Monitor I/Os    Chest pain: w/ elevated troponins. Likely secondary to demand ischemia    Nicotine dependence: received smoking cessation counseling x 5 mins. Nicotine patch to prevent w/drawl    Hypokalemia: KCl repleated    Headache: likely secondary to HTN emergency. Improved w/ fioricet    Transaminitis: likely due to hepatic steatosis seen on CTA. Will continue to monitor    AKI: Cr is labile. Avoid nephrotoxic meds    Lymphadenopathy: w/ mass like area in left lower lobe present since 2008 but enlarged since that time. Etiology unclear, malignancy cannot be excluded. Will need to see pulmon outpatient for further evaluation. Discussed w/ pt via interpreter and pt verbalized his understanding.    Obesity: BMI 37.3. Complicates overall care & prognosis   Discharge Instructions  Discharge Instructions     Diet - low sodium heart healthy   Complete by: As directed    Discharge instructions   Complete by: As directed    F/u w/ PCP in 1-2 weeks. F/u w/ cardio, [facility], in 1-2 weeks. F/u pulmonology, Dr. Patsey Berthold, in 1-2 weeks.  Another option: [facility] in [place], [place] ([place], [place], [place]. Phone : [phone]). For abnormal CT chest findings: Masslike area in the LEFT lower lobe present as far back as 2008 with enlargement since that time. Findings are highly suggestive of pulmonary  sequestration receiving vascular supply from the distal thoracic aorta on previous imaging. Mild nodal enlargement throughout the chest without frank pathologic enlargement but with numerous lymph nodes without clear cause. The possibility of underlying malignancy is not excluded. Short interval follow-up within 3 months may be helpful to track for any changes.   Increase activity slowly   Complete by: As directed       Allergies as of 12/05/2021   Not on File      Medication List     STOP taking these medications    amLODipine 10 MG tablet Commonly known as: NORVASC       TAKE these medications    furosemide 20 MG tablet Commonly known as: Lasix Tome 1 tableta (20 mg en total) por va oral diariamente. (Take 1 tablet (20 mg total) by mouth daily.)   hydrALAZINE 100 MG tablet Commonly known as: APRESOLINE Take 100 mg by mouth 3 (three) times daily.   losartan-hydrochlorothiazide 100-25 MG tablet Commonly known as: HYZAAR Tome 1 tableta por va oral diariamente. (Take 1 tablet by mouth daily.)   metoprolol tartrate 100 MG tablet Commonly known as: LOPRESSOR Take 1 tablet (100 mg total) by mouth 2 (two) times daily.   potassium chloride SA 20 MEQ tablet Commonly known as: KLOR-CON M Tome una tableta (20 mEq en total) por va oral diariamente. (Take 1 tablet (20 mEq total) by mouth daily.)        Follow-up Information  Tyler Pita, MD. Call on 12/05/2021.   Specialty: Pulmonary Disease Why: Tried to make an appointment but Dr. Patsey Berthold was booked until September. Nurse is going to talk to doctor to try and set up appointment for earlier than that. They will call patient's number to make appointment. Contact information: [place] [address] [phone]                Not on File  Consultations:    Procedures/Studies: ECHOCARDIOGRAM COMPLETE  Result Date: 12/03/2021    ECHOCARDIOGRAM REPORT   Patient Name:    RAMON GRUBERT Date of Exam: 12/03/2021 Medical Rec #:  [id number]        Height:       65.0 in Accession #:    [id number]       Weight:       210.0 lb Date of Birth:  [birth year]         BSA:          2.019 m Patient Age:    42 years         BP:           182/123 mmHg Patient Gender: M                HR:           68 bpm. Exam Location:  [facility] Procedure: 2D Echo, Color Doppler and Cardiac Doppler Indications:     I50.31 congestive heart failure-Acute Diastolic  History:         Patient has no prior history of Echocardiogram examinations.                  Risk Factors:Hypertension.  Sonographer:     Charmayne Sheer Referring Phys:  [id number] Athena Masse Diagnosing Phys: Serafina Royals MD  Sonographer Comments: Suboptimal parasternal window and suboptimal subcostal window. IMPRESSIONS  1. Left ventricular ejection fraction, by estimation, is 45 to 50%. The left ventricle has mildly decreased function. The left ventricle has no regional wall motion abnormalities. Left ventricular diastolic parameters were normal.  2. Right ventricular systolic function is normal. The right ventricular size is normal.  3. The mitral valve is normal in structure. Mild to moderate mitral valve regurgitation.  4. The aortic valve is normal in structure. Aortic valve regurgitation is not visualized. FINDINGS  Left Ventricle: Left ventricular ejection fraction, by estimation, is 45 to 50%. The left ventricle has mildly decreased function. The left ventricle has no regional wall motion abnormalities. The left ventricular internal cavity size was normal in size. There is no left ventricular hypertrophy. Left ventricular diastolic parameters were normal. Right Ventricle: The right ventricular size is normal. No increase in right ventricular wall thickness. Right ventricular systolic function is normal. Left Atrium: Left atrial size was normal in size. Right Atrium: Right atrial size was normal in size. Pericardium: There is no evidence of  pericardial effusion. Mitral Valve: The mitral valve is normal in structure. Mild to moderate mitral valve regurgitation. Tricuspid Valve: The tricuspid valve is normal in structure. Tricuspid valve regurgitation is mild. Aortic Valve: The aortic valve is normal in structure. Aortic valve regurgitation is not visualized. Aortic valve mean gradient measures 11.0 mmHg. Aortic valve peak gradient measures 19.2 mmHg. Aortic valve area, by VTI measures 1.66 cm. Pulmonic Valve: The pulmonic valve was normal in structure. Pulmonic valve regurgitation is not visualized. Aorta: The aortic root and ascending aorta are structurally normal, with no evidence of dilitation.  IAS/Shunts: No atrial level shunt detected by color flow Doppler.  LEFT VENTRICLE PLAX 2D LVIDd:         4.10 cm   Diastology LVIDs:         2.88 cm   LV e' medial:    5.11 cm/s LV PW:         1.72 cm   LV E/e' medial:  19.1 LV IVS:        1.49 cm   LV e' lateral:   5.66 cm/s LVOT diam:     1.90 cm   LV E/e' lateral: 17.3 LV SV:         62 LV SV Index:   31 LVOT Area:     2.84 cm  RIGHT VENTRICLE RV Basal diam:  3.45 cm RV S prime:     11.10 cm/s TAPSE (M-mode): 2.4 cm LEFT ATRIUM             Index        RIGHT ATRIUM           Index LA diam:        4.40 cm 2.18 cm/m   RA Area:     15.70 cm LA Vol (A2C):   55.0 ml 27.25 ml/m  RA Volume:   38.10 ml  18.87 ml/m LA Vol (A4C):   79.8 ml 39.53 ml/m LA Biplane Vol: 71.4 ml 35.37 ml/m  AORTIC VALVE                     PULMONIC VALVE AV Area (Vmax):    1.58 cm      PV Vmax:       1.15 m/s AV Area (Vmean):   1.54 cm      PV Peak grad:  5.3 mmHg AV Area (VTI):     1.66 cm AV Vmax:           219.00 cm/s AV Vmean:          157.000 cm/s AV VTI:            0.374 m AV Peak Grad:      19.2 mmHg AV Mean Grad:      11.0 mmHg LVOT Vmax:         122.00 cm/s LVOT Vmean:        85.200 cm/s LVOT VTI:          0.219 m LVOT/AV VTI ratio: 0.59  AORTA Ao Root diam: 3.20 cm MITRAL VALVE               TRICUSPID VALVE MV Area  (PHT): 3.48 cm    TR Peak grad:   43.8 mmHg MV Decel Time: 218 msec    TR Vmax:        331.00 cm/s MV E velocity: 97.70 cm/s MV A velocity: 67.70 cm/s  SHUNTS MV E/A ratio:  1.44        Systemic VTI:  0.22 m                            Systemic Diam: 1.90 cm Arnoldo Hooker MD Electronically signed by Arnoldo Hooker MD Signature Date/Time: 12/03/2021/4:24:11 PM    Final    CT ANGIO HEAD NECK W WO CM  Result Date: 12/02/2021 CLINICAL DATA:  Initial evaluation for acute headache. EXAM: CT ANGIOGRAPHY HEAD AND NECK TECHNIQUE: Multidetector CT imaging of the head and neck was performed using the standard protocol  during bolus administration of intravenous contrast. Multiplanar CT image reconstructions and MIPs were obtained to evaluate the vascular anatomy. Carotid stenosis measurements (when applicable) are obtained utilizing NASCET criteria, using the distal internal carotid diameter as the denominator. RADIATION DOSE REDUCTION: This exam was performed according to the departmental dose-optimization program which includes automated exposure control, adjustment of the mA and/or kV according to patient size and/or use of iterative reconstruction technique. CONTRAST:  170mL OMNIPAQUE IOHEXOL 350 MG/ML SOLN COMPARISON:  CT from earlier the same day. FINDINGS: CTA NECK FINDINGS Aortic arch: Visualized aortic arch normal caliber with standard 3 vessel morphology. No stenosis or other abnormality about the origin the great vessels. Right carotid system: Right common and internal carotid arteries patent without stenosis or dissection. Left carotid system: Left common and internal carotid arteries patent without stenosis or dissection. Vertebral arteries: Both vertebral arteries arise from the subclavian arteries. No proximal subclavian artery stenosis. Both vertebral arteries widely patent without stenosis, dissection or occlusion. Skeleton: No discrete or worrisome osseous lesions. Other neck: No other acute soft tissue  abnormality within the neck. Upper chest: Multiple mildly prominent subcentimeter lymph nodes noted within the visualized mediastinum, nonspecific, but could be reactive. Visualized upper chest demonstrates no other acute finding. Review of the MIP images confirms the above findings CTA HEAD FINDINGS Anterior circulation: Both internal carotid arteries widely patent to the termini without stenosis. A1 segments widely patent. Normal anterior communicating artery complex. Both anterior cerebral arteries widely patent to their distal aspects without stenosis. No M1 stenosis or occlusion. Normal MCA bifurcations. Distal MCA branches well perfused and symmetric. Posterior circulation: Both vertebral arteries patent without stenosis. Left vertebral artery dominant. Both PICA patent. Basilar patent to its distal aspect without stenosis. Superior cerebellar arteries patent bilaterally. Left PCA primarily supplied via the basilar. Predominant fetal type origin of the right PCA. Both PCAs patent to their distal aspects without stenosis. Venous sinuses: Patent allowing for timing the contrast bolus. Anatomic variants: As above.  No intracranial aneurysm. Review of the MIP images confirms the above findings IMPRESSION: 1. Normal CTA of the head and neck. No large vessel occlusion, hemodynamically significant stenosis, or other acute vascular abnormality. No aneurysm. 2. Multiple mildly prominent subcentimeter lymph nodes within the visualized mediastinum, nonspecific, but could be reactive. Electronically Signed   By: Jeannine Boga M.D.   On: 12/02/2021 23:03   CT Angio Chest Aorta W and/or Wo Contrast  Result Date: 12/02/2021 CLINICAL DATA:  51 year old male presents with suspected acute aortic syndrome and chest pain. EXAM: CT ANGIOGRAPHY CHEST WITH CONTRAST TECHNIQUE: Multidetector CT imaging of the chest was performed using the standard protocol during bolus administration of intravenous contrast. Multiplanar CT  image reconstructions and MIPs were obtained to evaluate the vascular anatomy. RADIATION DOSE REDUCTION: This exam was performed according to the departmental dose-optimization program which includes automated exposure control, adjustment of the mA and/or kV according to patient size and/or use of iterative reconstruction technique. CONTRAST:  172mL OMNIPAQUE IOHEXOL 350 MG/ML SOLN COMPARISON:  Prior CT of the chest, abdomen and pelvis from 2008. FINDINGS: Cardiovascular: Noncontrast imaging without stranding adjacent to the aorta and without signs of intramural hematoma. Aortic contour is smooth. Mildly limited study due to bolus timing but without signs of aortic dissection or aneurysm. Three-vessel branching pattern in the chest. Ascending thoracic aortic caliber 3.5 cm. Descending thoracic aortic caliber 2.6 cm. Heart size moderate to markedly enlarged. No pericardial effusion or signs of pericardial nodularity. Central pulmonary vasculature unremarkable on venous phase  with limited assessment due to bolus timing. Mediastinum/Nodes: Scattered lymph nodes throughout the chest not exceeding 15 mm short axis but some with top-normal size, for instance (image 49/6) RIGHT paratracheal lymph node 14 mm. (Image 48/6) 14 mm AP window lymph node. Numerous other smaller lymph nodes in the chest. Lungs/Pleura: Ovoid masslike area in the LEFT lung base measuring 4.6 x 3.4 cm with mixed attenuation was present as far back as 2008 measuring 3.3 x 2.6 cm appeared more compatible with atelectatic lung at that time. Subtle accessory fissure may track away from this area. No discrete vascular structure extending towards this area though perhaps a draining vein passing into the hemi azygous system (image 119/6). No pneumothorax. No pleural effusion. Airways are patent. Query mild septal thickening at the lung bases and mild ground-glass though there is clearly atelectasis and there is respiratory motion. Upper Abdomen: Marked  hepatic steatosis. Lobular hepatic contours. Liver is incompletely imaged. Musculoskeletal: Spinal degenerative changes. No acute or destructive bone process. Review of the MIP images confirms the above findings. IMPRESSION: 1. No signs of acute aortic process. Study mildly limited by bolus timing. 2. Masslike area in the LEFT lower lobe present as far back as 2008 with enlargement since that time. 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 Venous drainage potentially via systemic venous drainage via azygous system. Pulmonary consultation may be helpful as these areas can become superinfected. 3. Peri mild septal thickening and some ground-glass attenuation though areas of atelectasis and respiratory motion limit assessment. Correlate with signs of heart failure. 4. Mild nodal enlargement throughout the chest without frank pathologic enlargement but with numerous lymph nodes without clear cause. The possibility of underlying malignancy is not excluded. Short interval follow-up within 3 months may be helpful to track for any changes. Would also correlate with any current signs of are history of heart failure as this can also present with mild nodal enlargement. 5. Hepatic steatosis. Correlate with clinical or laboratory evidence of hepatic dysfunction given severity of steatotic changes. Aortic Atherosclerosis (ICD10-I70.0). Electronically Signed   By: Donzetta Kohut M.D.   On: 12/02/2021 21:51   DG Chest 2 View  Result Date: 12/02/2021 CLINICAL DATA:  Chest pain EXAM: CHEST - 2 VIEW COMPARISON:  None Available. FINDINGS: The heart size and mediastinal contours are within normal limits. Both lungs are clear. The visualized skeletal structures are unremarkable. IMPRESSION: No active cardiopulmonary disease. Electronically Signed   By: Charlett Nose M.D.   On: 12/02/2021 19:44   CT Head Wo Contrast  Result Date:  12/02/2021 CLINICAL DATA:  Male at age 71 presents for evaluation of headache, new or worsening headache. EXAM: CT HEAD WITHOUT CONTRAST TECHNIQUE: Contiguous axial images were obtained from the base of the skull through the vertex without intravenous contrast. RADIATION DOSE REDUCTION: This exam was performed according to the departmental dose-optimization program which includes automated exposure control, adjustment of the mA and/or kV according to patient size and/or use of iterative reconstruction technique. COMPARISON:  None Available. FINDINGS: Brain: No evidence of acute infarction, hemorrhage, hydrocephalus, extra-axial collection or mass lesion/mass effect. Signs of mild atrophy and chronic microvascular ischemic change. Chronic appearing lacunar infarcts in the RIGHT and LEFT basal ganglia. Vascular: No hyperdense vessel or unexpected calcification. Skull: Normal. Negative for fracture or focal lesion. Sinuses/Orbits: Visualized paranasal sinuses and orbits without acute process. Maxillary sinuses are incompletely evaluated. Other: None IMPRESSION: 1. No acute intracranial abnormality. 2. Signs of  mild atrophy and chronic microvascular ischemic change. Chronic appearing lacunar infarcts in the bilateral basal ganglia. Electronically Signed   By: Zetta Bills M.D.   On: 12/02/2021 19:18   (Echo, Carotid, EGD, Colonoscopy, ERCP)    Subjective: Pt denies any complaints    Discharge Exam: Vitals:   12/05/21 1107 12/05/21 1135  BP: (!) 176/109 (!) 169/97  Pulse: 61   Resp:    Temp:    SpO2:     Vitals:   12/05/21 0543 12/05/21 0818 12/05/21 1107 12/05/21 1135  BP: (!) 140/96 (!) 166/112 (!) 176/109 (!) 169/97  Pulse: 65 70 61   Resp:  16    Temp:  98.1 F (36.7 C)    TempSrc:      SpO2:  98%    Weight:      Height:        General: Pt is alert, awake, not in acute distress Cardiovascular: S1/S2 +, no rubs, no gallops Respiratory: CTA bilaterally, no wheezing, no  rhonchi Abdominal: Soft, NT, obese, bowel sounds + Extremities: no cyanosis    The results of significant diagnostics from this hospitalization (including imaging, microbiology, ancillary and laboratory) are listed below for reference.     Microbiology: No results found for this or any previous visit (from the past 240 hour(s)).   Labs: BNP (last 3 results) Recent Labs    12/02/21 1843  BNP 0000000*   Basic Metabolic Panel: Recent Labs  Lab 12/02/21 1843 12/03/21 0614 12/04/21 0452 12/05/21 0500  NA 140 137 139 140  K 3.2* 3.0* 3.1* 3.4*  CL 106 106 105 107  CO2 25 24 26 25   GLUCOSE 137* 120* 128* 124*  BUN 20 21* 21* 27*  CREATININE 1.53* 1.46* 1.52* 1.70*  CALCIUM 8.8* 8.5* 8.3* 8.8*  MG  --  2.3  --  2.4   Liver Function Tests: Recent Labs  Lab 12/02/21 1843 12/05/21 0500  AST 58* 31  ALT 55* 49*  ALKPHOS 69 64  BILITOT 0.5 0.5  PROT 6.8 6.8  ALBUMIN 3.6 3.4*   No results for input(s): "LIPASE", "AMYLASE" in the last 168 hours. No results for input(s): "AMMONIA" in the last 168 hours. CBC: Recent Labs  Lab 12/02/21 1843 12/04/21 0452 12/05/21 0500  WBC 10.9* 9.1 9.5  HGB 15.8 15.8 16.0  HCT 44.7 45.5 46.9  MCV 82.2 82.3 83.5  PLT 261 232 251   Cardiac Enzymes: No results for input(s): "CKTOTAL", "CKMB", "CKMBINDEX", "TROPONINI" in the last 168 hours. BNP: Invalid input(s): "POCBNP" CBG: No results for input(s): "GLUCAP" in the last 168 hours. D-Dimer No results for input(s): "DDIMER" in the last 72 hours. Hgb A1c No results for input(s): "HGBA1C" in the last 72 hours. Lipid Profile No results for input(s): "CHOL", "HDL", "LDLCALC", "TRIG", "CHOLHDL", "LDLDIRECT" in the last 72 hours. Thyroid function studies No results for input(s): "TSH", "T4TOTAL", "T3FREE", "THYROIDAB" in the last 72 hours.  Invalid input(s): "FREET3" Anemia work up No results for input(s): "VITAMINB12", "FOLATE", "FERRITIN", "TIBC", "IRON", "RETICCTPCT" in the last  72 hours. Urinalysis No results found for: "COLORURINE", "APPEARANCEUR", "LABSPEC", "PHURINE", "GLUCOSEU", "HGBUR", "BILIRUBINUR", "KETONESUR", "PROTEINUR", "UROBILINOGEN", "NITRITE", "LEUKOCYTESUR" Sepsis Labs Recent Labs  Lab 12/02/21 1843 12/04/21 0452 12/05/21 0500  WBC 10.9* 9.1 9.5   Microbiology No results found for this or any previous visit (from the past 240 hour(s)).   Time coordinating discharge: Over 30 minutes  SIGNED:   Wyvonnia Dusky, MD  [facility] 12/05/2021, 11:38 AM Pager  If 7PM-7AM, please contact night-coverage [email or web address]

## 2021-12-05 NOTE — TOC Transition Note (Addendum)
Transition of Care [facility]) - CM/SW Discharge Note   Patient Details  Name: Bryan Burgess MRN: [id number] Date of Birth: 1970-08-29  Transition of Care [facility]) CM/SW Contact:  Candie Chroman, LCSW Phone Number: 12/05/2021, 12:28 PM   Clinical Narrative:   Patient has orders to discharge home today. Met with patient and son at bedside. No interpreter needed, per RN. Patient and son confirmed he does not have a PCP or insurance. Gave packet for free and low-cost healthcare in [facility] and intake paperwork for [facility]. Pres[facility]. They will call CSW when they are ready and CSW will ask volunteer services to pick them up. Son will transport him home today.  2:51 pm: Medications are ready and volunteer will bring them to the room. RN is aware. No further concerns. CSW signing off.  Final next level of care: Home/Self Care Barriers to Discharge: No Barriers Identified   Patient Goals and CMS Choice        Discharge Placement                Patient to be transferred to facility by: Son   Patient and family notified of of transfer: 12/05/21  Discharge Plan and Services                                     Social Determinants of Health (SDOH) Interventions     Readmission Risk Interventions     No data to display

## 2021-12-05 NOTE — Progress Notes (Signed)
AVS reviewed with pt; verbalized understanding of all instructions/education.  LDA removal complete. Pt dressed, awaiting son's arrival and medication delivery from pharmacy for d/c.
